# Patient Record
Sex: Female | Born: 1940 | Race: White | Hispanic: No | Smoking: Never smoker
Health system: Southern US, Community
[De-identification: ages and names within clinical notes are randomized; demographics above are authoritative.]

## PROBLEM LIST (undated history)

## (undated) DIAGNOSIS — I639 Cerebral infarction, unspecified: Secondary | ICD-10-CM

## (undated) HISTORY — PX: APPENDECTOMY: SHX54

## (undated) HISTORY — PX: CHOLECYSTECTOMY: SHX55

## (undated) HISTORY — PX: ABDOMINAL HYSTERECTOMY: SHX81

---

## 2013-12-25 ENCOUNTER — Other Ambulatory Visit: Payer: Self-pay | Admitting: Sports Medicine

## 2013-12-25 DIAGNOSIS — M25511 Pain in right shoulder: Secondary | ICD-10-CM

## 2014-01-03 ENCOUNTER — Other Ambulatory Visit: Payer: Self-pay

## 2014-01-05 ENCOUNTER — Ambulatory Visit
Admission: RE | Admit: 2014-01-05 | Discharge: 2014-01-05 | Disposition: A | Discharge status: Home / Self Care | Payer: PRIVATE HEALTH INSURANCE | Source: Ambulatory Visit | Attending: Sports Medicine | Admitting: Sports Medicine

## 2014-01-05 DIAGNOSIS — M25511 Pain in right shoulder: Secondary | ICD-10-CM

## 2020-10-24 LAB — EXTERNAL GENERIC LAB PROCEDURE: COLOGUARD: NEGATIVE

## 2021-10-29 ENCOUNTER — Other Ambulatory Visit: Payer: Self-pay | Admitting: Sports Medicine

## 2021-10-29 DIAGNOSIS — M5126 Other intervertebral disc displacement, lumbar region: Secondary | ICD-10-CM

## 2021-11-08 ENCOUNTER — Ambulatory Visit
Admission: RE | Admit: 2021-11-08 | Discharge: 2021-11-08 | Disposition: A | Discharge status: Home / Self Care | Payer: Self-pay | Source: Ambulatory Visit | Attending: Sports Medicine | Admitting: Sports Medicine

## 2021-11-08 DIAGNOSIS — M5126 Other intervertebral disc displacement, lumbar region: Secondary | ICD-10-CM

## 2023-09-12 ENCOUNTER — Emergency Department (HOSPITAL_BASED_OUTPATIENT_CLINIC_OR_DEPARTMENT_OTHER)

## 2023-09-12 ENCOUNTER — Emergency Department (HOSPITAL_BASED_OUTPATIENT_CLINIC_OR_DEPARTMENT_OTHER)
Admission: EM | Admit: 2023-09-12 | Discharge: 2023-09-12 | Disposition: A | Discharge status: Home / Self Care | Attending: Emergency Medicine | Admitting: Emergency Medicine

## 2023-09-12 ENCOUNTER — Other Ambulatory Visit: Payer: Self-pay

## 2023-09-12 ENCOUNTER — Encounter (HOSPITAL_BASED_OUTPATIENT_CLINIC_OR_DEPARTMENT_OTHER): Payer: Self-pay | Admitting: Emergency Medicine

## 2023-09-12 DIAGNOSIS — K8689 Other specified diseases of pancreas: Secondary | ICD-10-CM | POA: Insufficient documentation

## 2023-09-12 DIAGNOSIS — E871 Hypo-osmolality and hyponatremia: Secondary | ICD-10-CM | POA: Insufficient documentation

## 2023-09-12 DIAGNOSIS — E876 Hypokalemia: Secondary | ICD-10-CM | POA: Insufficient documentation

## 2023-09-12 DIAGNOSIS — R059 Cough, unspecified: Secondary | ICD-10-CM | POA: Diagnosis present

## 2023-09-12 DIAGNOSIS — R0981 Nasal congestion: Secondary | ICD-10-CM | POA: Diagnosis not present

## 2023-09-12 DIAGNOSIS — J189 Pneumonia, unspecified organism: Secondary | ICD-10-CM

## 2023-09-12 DIAGNOSIS — I10 Essential (primary) hypertension: Secondary | ICD-10-CM | POA: Diagnosis not present

## 2023-09-12 HISTORY — DX: Cerebral infarction, unspecified: I63.9

## 2023-09-12 LAB — CBC
HCT: 39 % (ref 36.0–46.0)
Hemoglobin: 13.5 g/dL (ref 12.0–15.0)
MCH: 32.8 pg (ref 26.0–34.0)
MCHC: 34.6 g/dL (ref 30.0–36.0)
MCV: 94.9 fL (ref 80.0–100.0)
Platelets: 378 10*3/uL (ref 150–400)
RBC: 4.11 MIL/uL (ref 3.87–5.11)
RDW: 12.3 % (ref 11.5–15.5)
WBC: 9.1 10*3/uL (ref 4.0–10.5)
nRBC: 0 % (ref 0.0–0.2)

## 2023-09-12 LAB — RESP PANEL BY RT-PCR (RSV, FLU A&B, COVID)  RVPGX2
Influenza A by PCR: NEGATIVE
Influenza B by PCR: NEGATIVE
Resp Syncytial Virus by PCR: NEGATIVE
SARS Coronavirus 2 by RT PCR: NEGATIVE

## 2023-09-12 LAB — BASIC METABOLIC PANEL WITH GFR
Anion gap: 11 (ref 5–15)
BUN: 6 mg/dL — ABNORMAL LOW (ref 8–23)
CO2: 24 mmol/L (ref 22–32)
Calcium: 9.5 mg/dL (ref 8.9–10.3)
Chloride: 97 mmol/L — ABNORMAL LOW (ref 98–111)
Creatinine, Ser: 0.62 mg/dL (ref 0.44–1.00)
GFR, Estimated: >60 mL/min (ref >60)
Glucose, Bld: 95 mg/dL (ref 70–99)
Potassium: 3.4 mmol/L — ABNORMAL LOW (ref 3.5–5.1)
Sodium: 132 mmol/L — ABNORMAL LOW (ref 135–145)

## 2023-09-12 LAB — MAGNESIUM: Magnesium: 1.9 mg/dL (ref 1.7–2.4)

## 2023-09-12 MED ORDER — AZITHROMYCIN 250 MG PO TABS: 250.0000 mg | ORAL_TABLET | Freq: Every day | ORAL | 0 refills | Status: AC

## 2023-09-12 MED ORDER — CEFUROXIME AXETIL 500 MG PO TABS: 500.0000 mg | ORAL_TABLET | Freq: Two times a day (BID) | ORAL | 0 refills | Status: AC

## 2023-09-12 MED ORDER — BENZONATATE 100 MG PO CAPS
200.0000 mg | ORAL_CAPSULE | Freq: Once | ORAL | Status: AC
  Administered 2023-09-12: 200 mg via ORAL
  Filled 2023-09-12: qty 2

## 2023-09-12 MED ORDER — IOHEXOL 300 MG/ML  SOLN
75.0000 mL | Freq: Once | INTRAMUSCULAR | Status: AC | PRN
  Administered 2023-09-12: 75 mL via INTRAVENOUS

## 2023-09-12 MED ORDER — BENZONATATE 100 MG PO CAPS: 100.0000 mg | ORAL_CAPSULE | Freq: Three times a day (TID) | ORAL | 0 refills | Status: AC | PRN

## 2023-09-12 NOTE — ED Triage Notes (Signed)
 Cough and chest congestion x 4 weeks , left otalgia x 4 weeks , denies chest pain or shortness of breath . OTC meds with no relief .

## 2023-09-12 NOTE — Discharge Instructions (Signed)
 As discussed, will place on antibiotics given concern for pneumonia on CT imaging.  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  Recommend following up with oncology in the outpatient setting.  See number attached to schedule an appointment.  Please do not hesitate to return if the worrisome signs and symptoms we discussed become apparent.

## 2023-09-12 NOTE — ED Notes (Signed)
 Pt. Reports she has had a cough for approx.  3-4 weeks.  Pt. Reports she was on Keflex for several weeks due to UTI and needed x 2 due to never got rid of infection with first round of abx.  Pt. In no distress and is not eating or drinking well.

## 2023-09-12 NOTE — ED Provider Notes (Signed)
 [facility] Provider Note   CSN: [id number] Arrival date & time: 09/12/23  1228     History  Chief Complaint  Patient presents with   Cough    Caitlin Martin is a 83 y.o. female.   Cough   83 year old female presents emergency department plaints of cough, chest congestion, nasal congestion/drainage.  States that symptoms been present for the past 4 weeks or so.  Was seen at the urgent care and diagnosed with seasonal allergies.  Has been taking over-the-counter medications without significant improvement of symptoms.  Denies any chest pain, shortness of breath, fevers, chills, abdominal pain, nausea, vomiting.  Patient has a history of walking pneumonia is concerned about the same. States she is even taken Robitussin DM has been elevating her blood pressure.  Reports history of hypertension but states that every medication that she has been tried on for blood pressure has had adverse side effects.  States that she typically has blood pressure with elevated while in the doctor's office for hospital and it returns to normal when she is home.  Has a long at bedside of her blood pressures for the past week.  Past medical history significant for stroke, hypertension, osteopenia, hyperlipidemia, vitamin D deficiency  Home Medications Prior to Admission medications   Not on File      Allergies    Codeine, Doxycycline, Penicillins, Ceftriaxone, Clarithromycin, Clindamycin, Dicyclomine, Morphine, and Sulfa antibiotics    Review of Systems   Review of Systems  Respiratory:  Positive for cough.   All other systems reviewed and are negative.   Physical Exam Updated Vital Signs BP (!) 191/90   Pulse 84   Temp 97.9 F (36.6 C) (Oral)   Resp 17   Wt 52.6 kg   SpO2 98%  Physical Exam Vitals and nursing note reviewed.  Constitutional:      General: She is not in acute distress.    Appearance: She is well-developed.  HENT:     Head:  Normocephalic and atraumatic.     Right Ear: Tympanic membrane normal.     Left Ear: Tympanic membrane normal.     Nose: Congestion and rhinorrhea present.     Mouth/Throat:     Mouth: Mucous membranes are moist.     Pharynx: Oropharynx is clear.  Eyes:     Conjunctiva/sclera: Conjunctivae normal.  Cardiovascular:     Rate and Rhythm: Normal rate and regular rhythm.  Pulmonary:     Effort: Pulmonary effort is normal. No respiratory distress.     Breath sounds: Normal breath sounds. No wheezing, rhonchi or rales.  Abdominal:     Palpations: Abdomen is soft.     Tenderness: There is no abdominal tenderness.  Musculoskeletal:        General: No swelling.     Cervical back: Neck supple.  Skin:    General: Skin is warm and dry.     Capillary Refill: Capillary refill takes less than 2 seconds.  Neurological:     Mental Status: She is alert.  Psychiatric:        Mood and Affect: Mood normal.     ED Results / Procedures / Treatments   Labs (all labs ordered are listed, but only abnormal results are displayed) Labs Reviewed  RESP PANEL BY RT-PCR (RSV, FLU A&B, COVID)  [id number]  BASIC METABOLIC PANEL WITH GFR  CBC    EKG None  Radiology No results found.  Procedures Procedures    Medications  Ordered in ED Medications  benzonatate  (TESSALON ) capsule 200 mg (200 mg Oral Given 09/12/23 1452)    ED Course/ Medical Decision Making/ A&P Clinical Course as of 09/12/23 1507  Mon Sep 12, 2023  1500 BP(!): 210/89 Patient with elevated blood pressure while in the ED.  Although this is most likely related to Robitussin DM use in the outpatient setting given patient having log of blood pressures for the past 7 days which were the highest 142 systolic over 89.  Given significant hypertension, desired to treat hypertension with blood pressure medication.  Patient declined stating that when she would get home it would be normal.  States she has been on multiple blood pressure medications  in the past and has had adverse side effects hospitalizing her secondary to dropping her blood pressure too low.  Declining any blood pressure medication at this time after risks of untreated hypertension were discussed with patient.. [CR]    Clinical Course User Index [CR] [place] Butter, PA                                 Medical Decision Making Amount and/or Complexity of Data Reviewed Labs: ordered. Radiology: ordered.  Risk Prescription drug management.   This patient presents to the ED for concern of cough, this involves an extensive number of treatment options, and is a complaint that carries with it a high risk of complications and morbidity.  The differential diagnosis includes COVID, flu, RSV, pneumonia, malignancy, asthma, GERD, other   Co morbidities that complicate the patient evaluation  See HPI   Additional history obtained:  Additional history obtained from EMR External records from outside source obtained and reviewed including hospital records   Lab Tests:  I Ordered, and personally interpreted labs.  The pertinent results include: Viral testing negative.  Mild hyponatremia 132, hypokalemia 3.4, hypochloremia 97.  With no renal dysfunction.  No leukocytosis.  No evidence of anemia.  Platelets within range.   Imaging Studies ordered:  I ordered imaging studies including chest x-ray, CT chest I independently visualized and interpreted imaging which showed  Chest x-ray: No acute cardiopulmonary abnormality. CT chest: Mild right middle, lingular bilateral lower lobe bronchiectasis with mucous plugging.  4 mm hypodensity pancreatic neck.  Aortic atherosclerosis. I agree with the radiologist interpretation  Cardiac Monitoring: / EKG:  The patient was maintained on a cardiac monitor.  I personally viewed and interpreted the cardiac monitored which showed an underlying rhythm of: Sinus rhythm   Consultations Obtained:  ED course  Problem List / ED  Course / Critical interventions / Medication management  Cough, pancreatic mass I ordered medication including benzonatate  Reevaluation of the patient after these medicines showed that the patient improved I have reviewed the patients home medicines and have made adjustments as needed   Social Determinants of Health:  Denies tobacco, illicit drug use.   Test / Admission - Considered:  Cough, pancreatic mass Vitals signs significant for hypertension. Otherwise within normal range and stable throughout visit. Laboratory/imaging studies significant for: See above 83 year old female presents emergency department plaints of cough, chest congestion, nasal congestion/drainage.  States that symptoms been present for the past 4 weeks or so.  Was seen at the urgent care and diagnosed with seasonal allergies.  Has been taking over-the-counter medications without significant improvement of symptoms.  Denies any chest pain, shortness of breath, fevers, chills, abdominal pain, nausea, vomiting.  Patient has a history of  walking pneumonia is concerned about the same. States she is even taken Robitussin DM has been elevating her blood pressure.  Reports history of hypertension but states that every medication that she has been tried on for blood pressure has had adverse side effects.  States that she typically has blood pressure with elevated while in the doctor's office for hospital and it returns to normal when she is home.  Has a long at bedside of her blood pressures for the past week.  Patient's hypertension improved in the ED from around 190 systolic to 140/86 upon discharge independent of medical therapy. On exam, no appreciable wheeze, rales, or rhonchi.  Nasal congestion as well as rhinorrhea present with postnasal drip.  Labs unremarkable for any acute emergent process.  Chest imaging concerning for bronchiectasis as well as pancreatic mass.  Will place patient on antibiotics given duration of time the  patient has been symptomatic as well as recommend further symptomatic therapy as described in AVS.  Regarding pancreatic mass, will recommend follow-up with oncology.  Treatment plan discussed with patient and she acknowledged understanding was agreeable to said plan.  Patient well-appearing, afebrile in no acute distress. Worrisome signs and symptoms were discussed with the patient, and the patient acknowledged understanding to return to the ED if noticed. Patient was stable upon discharge.          Final Clinical Impression(s) / ED Diagnoses Final diagnoses:  None    Rx / DC Orders ED Discharge Orders     None         [place] Butter, [place] 09/12/23 2153    Tegeler, Marine Sia, MD 09/12/23 [phone]

## 2023-09-14 ENCOUNTER — Telehealth: Payer: Self-pay | Admitting: Gastroenterology

## 2023-09-14 NOTE — Telephone Encounter (Signed)
 This patient is well-established with the [facility] and has even been seen back in 2024. The pancreatic lesion that is noted in the CT of the chest is small but certainly does require at least discussion of active surveillance and dedicated imaging of the pancreas, to decide what needs to be done thereafter. With this being said, it is most ideal that she remain with her current gastroenterology group ([facility]) that she has followed with in just the most recent past. If there are issues where in which she cannot be seen by her GI group, then consideration for transfer of care can be made, but based again on her current imaging, no need for endoscopic ultrasound currently an appointment to be seen in clinic with GI it makes the most sense. GM

## 2023-09-14 NOTE — Telephone Encounter (Signed)
 Good Morning Dr. Brice Campi,    Patients husband called stating that [facility] had sent over a referral to our practice for patient to be seen for Pancreatic mass. Patients records are in epic, will you please review and advise on scheduling patient for appointment?   Thank you.

## 2023-09-16 NOTE — Telephone Encounter (Signed)
 Good afternoon Dr. Brice Campi,   Upon advising patient of recommendations below, she states she would like you to reconsider. She states she does not want to go back to [facility],. She would like to be seen by you per her provider's recommendation. Would you be willing to accept transfer of care?  Thank you.

## 2023-09-16 NOTE — Telephone Encounter (Signed)
 Lvm for patient advising patient of Dr. Marolyn Sis recommendations. Advised to call back if there were any questions.

## 2023-09-17 NOTE — Telephone Encounter (Signed)
 Patient can be accepted to the [facility] for transition of care Will need to get the results of her last colonoscopy looks to be in 2009. She deferred on follow-up colonoscopy recommended a few years ago. Need to get any notes and results on anorectal manometry that may have been completed elsewhere. If patient is going to come to our group, I recommend she undergo an abdominal MRI/MRCP. She can be scheduled for a clinic visit with APP or myself (with me, a new patient visit only and do not overbook or use a held slot). The MRI/MRCP needs to be completed before the clinic visit (this order can be initiated/placed) as long as she agrees. If she does not agree, then she will need to wait until the clinic visit discuss further. Between now and the clinic visit she remains under the care of of the prior [facility] or her PCP if other GI issues develop. Thanks. GM

## 2023-09-19 NOTE — Telephone Encounter (Signed)
 Spoke with patient, offered appointment for tomorrow being Dr. Marolyn Sis cancellation. Patient states she cannot do morning Appts, but did not wish to schedule until July. States she was "too scared" to wait that long, patient states she "feels she has no choice" but to go back to [facility].

## 2023-09-20 ENCOUNTER — Ambulatory Visit: Admitting: Gastroenterology

## 2024-01-16 IMAGING — MR MR LUMBAR SPINE W/O CM
4 of 5 series · 26 of 48 positions shown · non-contrast
Comparison: 12/14/2011

CLINICAL DATA: Lower back pain radiating down the left leg for 3
months

EXAM:
MRI LUMBAR SPINE WITHOUT CONTRAST
TECHNIQUE: Multiplanar, multisequence MR imaging of the lumbar spine was
performed. No intravenous contrast was administered.

[Series 3: T2 · sagittal · 4.0mm · 0.49mm/px · 8 of 17 slices shown (1 of 2)]
[im 1/17]
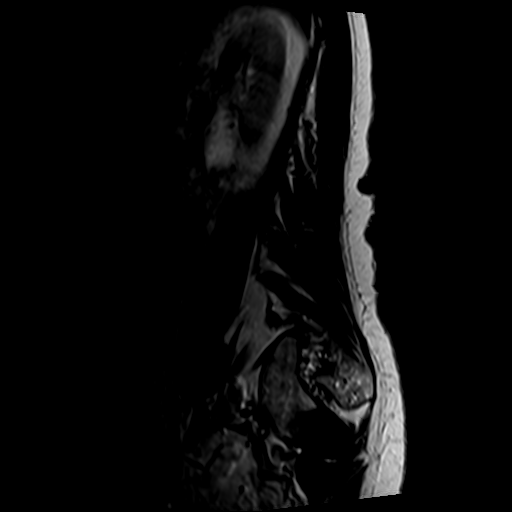
[im 3/17]
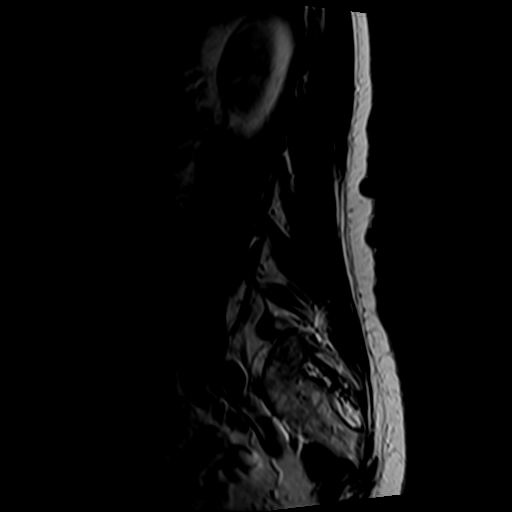
[im 5/17]
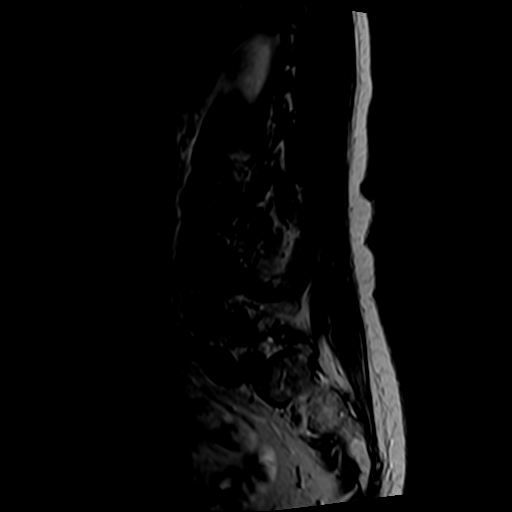
[im 7/17]
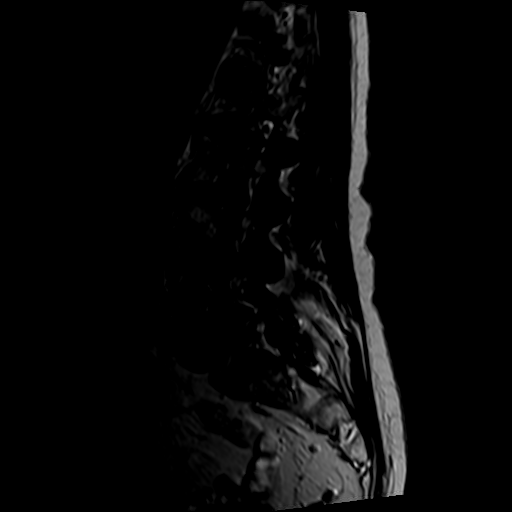
[im 10/17]
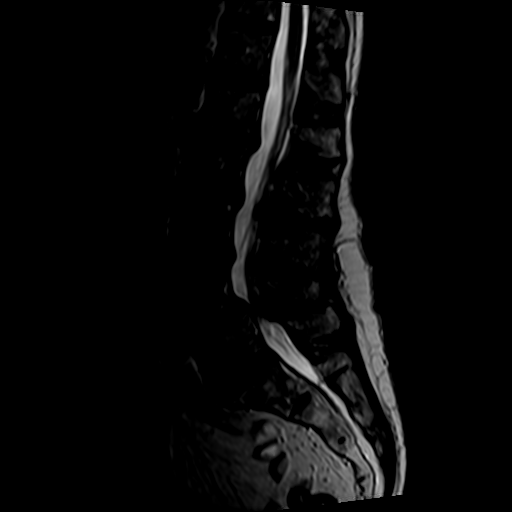
[im 12/17]
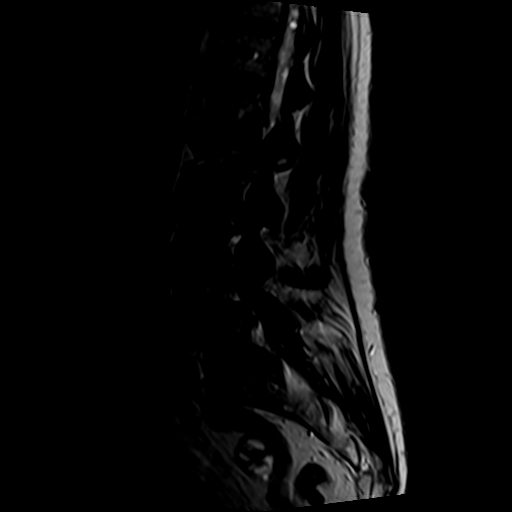
[im 14/17]
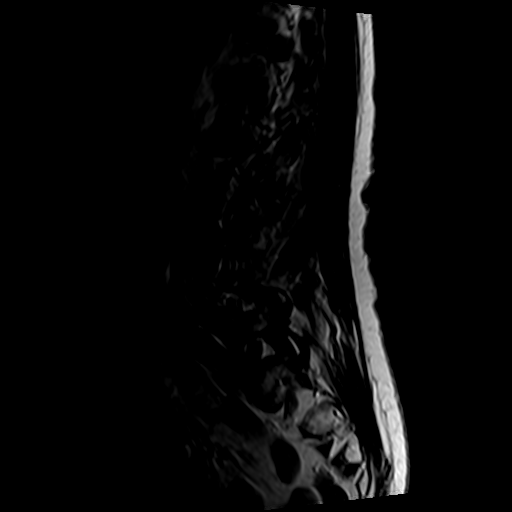
[im 17/17]
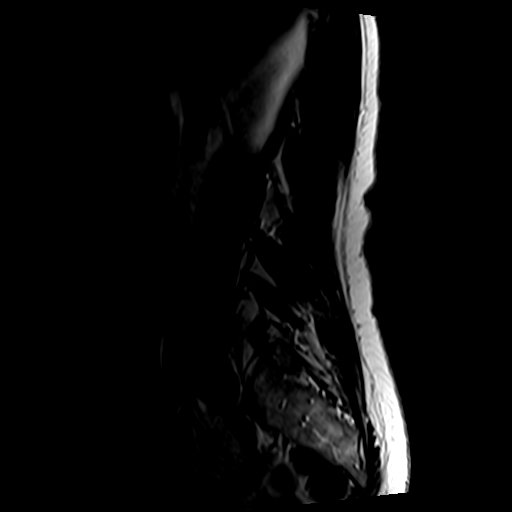

[Series 5: T1 · sagittal · 4.0mm · 0.49mm/px · 6 of 15 slices shown (1 of 2)]
[im 1/15]
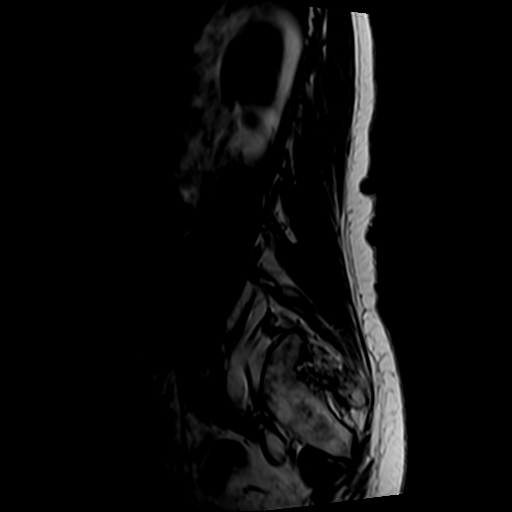
[im 3/15]
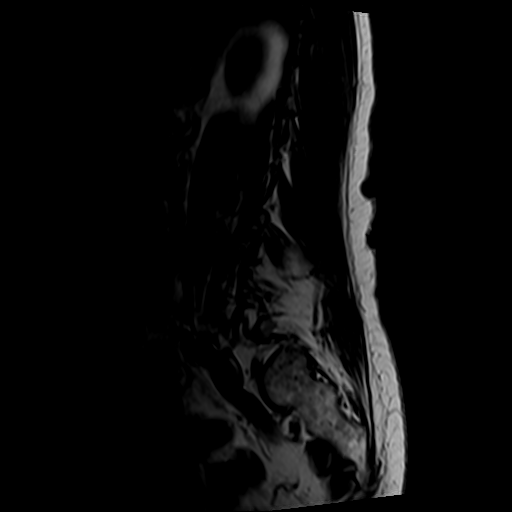
[im 5/15]
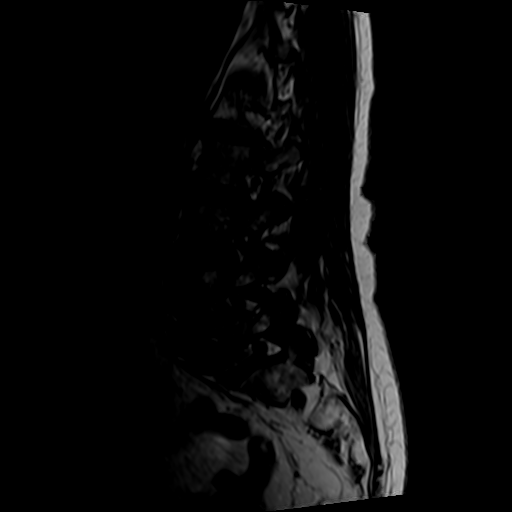
[im 8/15]
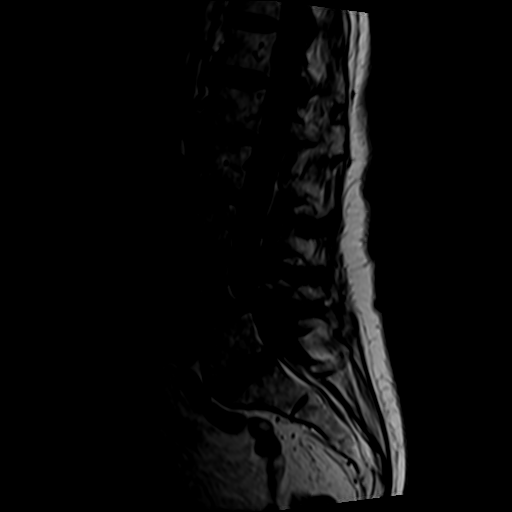
[im 10/15]
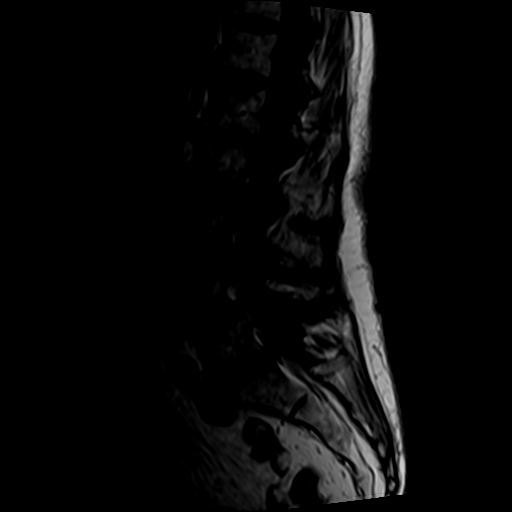
[im 12/15]
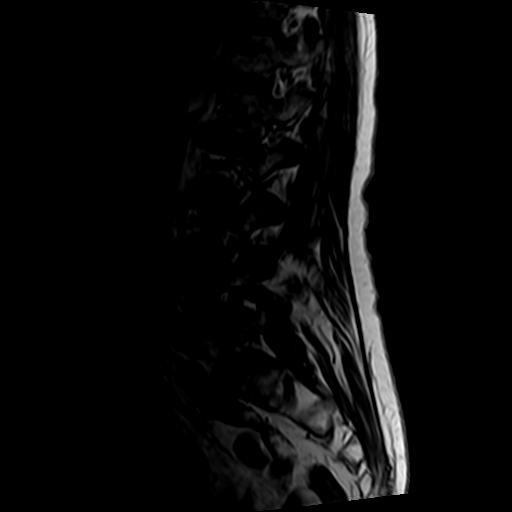

[Series 6: T2 · axial · 4.0mm · 0.70mm/px · z∈[-41,+156]mm · 9 of 30 slices shown (2 of 2)]
[im 1/30]
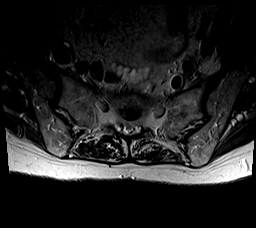
[im 5/30]
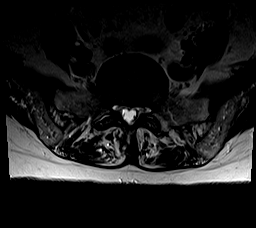
[im 10/30]
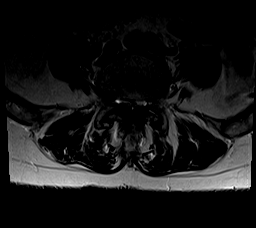
[im 13/30]
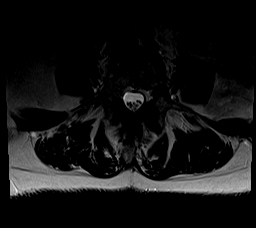
[im 15/30]
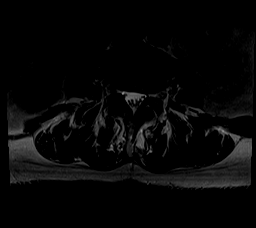
[im 17/30]
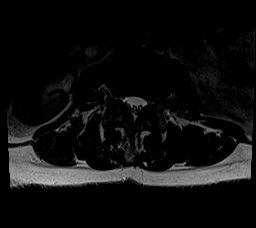
[im 20/30]
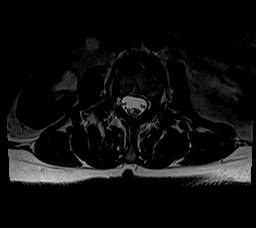
[im 25/30]
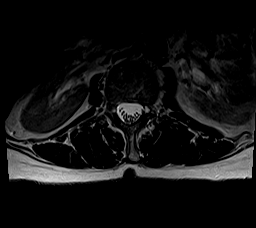
[im 30/30]
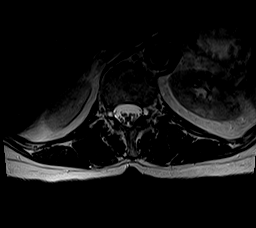

[Series 7: T1 · axial · 4.0mm · 0.35mm/px · z∈[-21,+131]mm · 3 of 30 slices shown (2 of 2)]
[im 5/30]
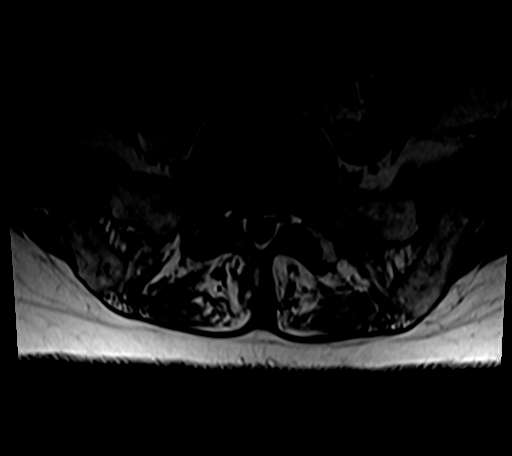
[im 15/30]
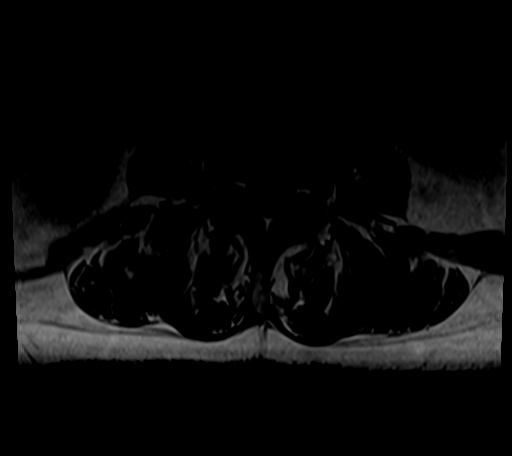
[im 25/30]
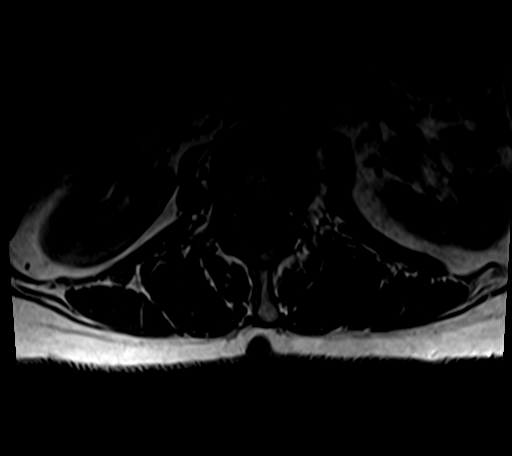

[26 of 48 positions shown; findings below may reference images not displayed]

FINDINGS: Segmentation: 5 lumbar type vertebrae based on the available
coverage

Alignment: Anterolisthesis at L5-S1 and especially at L4-5. Mild
scoliosis

Vertebrae:  No fracture, evidence of discitis, or bone lesion.

Conus medullaris and cauda equina: Conus extends to the T12-L1
level. Conus and cauda equina appear normal.

Paraspinal and other soft tissues: Negative for perispinal mass or
inflammation

Disc levels:

T12- L1: Unremarkable.

L1-L2: Disc narrowing and bulging with small central protrusion.

L2-L3: Disc narrowing and foraminal predominant bulging. No neural
compression

L3-L4: Degenerative facet spurring with ligamentum flavum
thickening. Mild disc bulging.

L4-L5: Facet osteoarthritis with spurring and anterolisthesis. The
disc is narrowed and bulging and there is severe spinal stenosis.
Mild-to-moderate right foraminal narrowing with mild flattening of
the nerve root on sagittal images.

L5-S1:Degenerative facet spurring with mild anterolisthesis. No
neural impingement
IMPRESSION: 1. Generalized lumbar spine degeneration most notable at L4-5 where
there is anterolisthesis and severe spinal stenosis.
2. L4-5 mild to moderate right foraminal narrowing.
